# Patient Record
Sex: Female | Born: 1980 | Hispanic: No | Smoking: Never smoker
Health system: Southern US, Community
[De-identification: ages and names within clinical notes are randomized; demographics above are authoritative.]

## PROBLEM LIST (undated history)

## (undated) DIAGNOSIS — M1990 Unspecified osteoarthritis, unspecified site: Secondary | ICD-10-CM

---
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
## 1999-03-23 ENCOUNTER — Other Ambulatory Visit: Admission: RE | Admit: 1999-03-23 | Discharge: 1999-03-23 | Payer: Self-pay | Admitting: Obstetrics and Gynecology

## 2002-06-18 ENCOUNTER — Other Ambulatory Visit: Admission: RE | Admit: 2002-06-18 | Discharge: 2002-06-18 | Payer: Self-pay | Admitting: Obstetrics and Gynecology

## 2003-10-06 ENCOUNTER — Other Ambulatory Visit: Admission: RE | Admit: 2003-10-06 | Discharge: 2003-10-06 | Payer: Self-pay | Admitting: Obstetrics and Gynecology

## 2004-10-19 ENCOUNTER — Other Ambulatory Visit: Admission: RE | Admit: 2004-10-19 | Discharge: 2004-10-19 | Payer: Self-pay | Admitting: Obstetrics and Gynecology

## 2011-05-22 ENCOUNTER — Encounter: Payer: Self-pay | Admitting: Family Medicine

## 2011-06-06 ENCOUNTER — Encounter: Payer: Self-pay | Admitting: Family Medicine

## 2018-10-29 ENCOUNTER — Other Ambulatory Visit: Payer: Self-pay

## 2018-10-29 ENCOUNTER — Emergency Department (HOSPITAL_COMMUNITY): Payer: 59

## 2018-10-29 ENCOUNTER — Encounter (HOSPITAL_COMMUNITY): Payer: Self-pay | Admitting: Emergency Medicine

## 2018-10-29 ENCOUNTER — Emergency Department (HOSPITAL_COMMUNITY)
Admission: EM | Admit: 2018-10-29 | Discharge: 2018-10-29 | Disposition: A | Discharge status: Home / Self Care | Payer: 59 | Attending: Emergency Medicine | Admitting: Emergency Medicine

## 2018-10-29 DIAGNOSIS — R630 Anorexia: Secondary | ICD-10-CM | POA: Insufficient documentation

## 2018-10-29 DIAGNOSIS — R0789 Other chest pain: Secondary | ICD-10-CM | POA: Diagnosis present

## 2018-10-29 DIAGNOSIS — R110 Nausea: Secondary | ICD-10-CM | POA: Insufficient documentation

## 2018-10-29 HISTORY — DX: Unspecified osteoarthritis, unspecified site: M19.90

## 2018-10-29 LAB — CBC WITH DIFFERENTIAL/PLATELET
Abs Immature Granulocytes: 0.03 10*3/uL (ref 0.00–0.07)
Basophils Absolute: 0 10*3/uL (ref 0.0–0.1)
Basophils Relative: 0 %
Eosinophils Absolute: 0 10*3/uL (ref 0.0–0.5)
Eosinophils Relative: 0 %
HCT: 35.2 % — ABNORMAL LOW (ref 36.0–46.0)
Hemoglobin: 11.5 g/dL — ABNORMAL LOW (ref 12.0–15.0)
Immature Granulocytes: 0 %
Lymphocytes Relative: 14 %
Lymphs Abs: 1 10*3/uL (ref 0.7–4.0)
MCH: 29.9 pg (ref 26.0–34.0)
MCHC: 32.7 g/dL (ref 30.0–36.0)
MCV: 91.4 fL (ref 80.0–100.0)
Monocytes Absolute: 0.5 10*3/uL (ref 0.1–1.0)
Monocytes Relative: 7 %
NRBC: 0 % (ref 0.0–0.2)
Neutro Abs: 5.6 10*3/uL (ref 1.7–7.7)
Neutrophils Relative %: 79 %
Platelets: 246 10*3/uL (ref 150–400)
RBC: 3.85 MIL/uL — ABNORMAL LOW (ref 3.87–5.11)
RDW: 12.5 % (ref 11.5–15.5)
WBC: 7.2 10*3/uL (ref 4.0–10.5)

## 2018-10-29 LAB — COMPREHENSIVE METABOLIC PANEL
ALT: 16 U/L (ref 0–44)
AST: 20 U/L (ref 15–41)
Albumin: 4.1 g/dL (ref 3.5–5.0)
Alkaline Phosphatase: 61 U/L (ref 38–126)
Anion gap: 11 (ref 5–15)
BUN: 9 mg/dL (ref 6–20)
CO2: 23 mmol/L (ref 22–32)
Calcium: 9.1 mg/dL (ref 8.9–10.3)
Chloride: 105 mmol/L (ref 98–111)
Creatinine, Ser: 0.72 mg/dL (ref 0.44–1.00)
GFR calc Af Amer: >60 mL/min (ref >60)
GFR calc non Af Amer: >60 mL/min (ref >60)
Glucose, Bld: 105 mg/dL — ABNORMAL HIGH (ref 70–99)
Potassium: 3.2 mmol/L — ABNORMAL LOW (ref 3.5–5.1)
Sodium: 139 mmol/L (ref 135–145)
Total Bilirubin: 0.7 mg/dL (ref 0.3–1.2)
Total Protein: 6.9 g/dL (ref 6.5–8.1)

## 2018-10-29 LAB — SEDIMENTATION RATE: Sed Rate: 15 mm/hr (ref 0–22)

## 2018-10-29 LAB — I-STAT BETA HCG BLOOD, ED (MC, WL, AP ONLY): I-stat hCG, quantitative: <5 m[IU]/mL (ref <5)

## 2018-10-29 LAB — TROPONIN I: Troponin I: <0.03 ng/mL (ref <0.03)

## 2018-10-29 LAB — PROTIME-INR
INR: 1.04
Prothrombin Time: 13.6 seconds (ref 11.4–15.2)

## 2018-10-29 LAB — LIPASE, BLOOD: Lipase: 31 U/L (ref 11–51)

## 2018-10-29 LAB — CBG MONITORING, ED: Glucose-Capillary: 95 mg/dL (ref 70–99)

## 2018-10-29 LAB — MAGNESIUM: MAGNESIUM: 1.8 mg/dL (ref 1.7–2.4)

## 2018-10-29 MED ORDER — SUCRALFATE 1 G PO TABS: 1.0000 g | ORAL_TABLET | Freq: Three times a day (TID) | ORAL | 0 refills | Status: AC

## 2018-10-29 MED ORDER — OMEPRAZOLE 20 MG PO CPDR: 20.0000 mg | DELAYED_RELEASE_CAPSULE | Freq: Every day | ORAL | 0 refills | Status: AC

## 2018-10-29 MED ORDER — ASPIRIN 81 MG PO CHEW
324.0000 mg | CHEWABLE_TABLET | Freq: Once | ORAL | Status: AC
  Administered 2018-10-29: 324 mg via ORAL
  Filled 2018-10-29: qty 4

## 2018-10-29 NOTE — ED Triage Notes (Signed)
Pt reports mid chest pain onset intermittant over the last week hx anxiety attacks and multiple family stressors

## 2018-10-29 NOTE — ED Provider Notes (Signed)
[facility]-EMERGENCY DEPT Provider Note   CSN: [id number] Arrival date & time: 10/29/18  2123     History   Chief Complaint Chief Complaint  Patient presents with  . Chest Pain    HPI Angel Dickerson is a 37 y.o. female.  HPI Patient presents with concern of chest pain. Pain began about a week ago Pain has been persistent since onset, waxing, waning severity. Pain is focally in the sternum and between the clavicles. Pain is occasionally sore, severe, sharp. Patient has had no substantial change in spite of Tums and taking Protonix x4 over the past 48 hours. No fever, no vomiting, there is some nausea, anorexia. No diarrhea.  Past Medical History:  Diagnosis Date  . Arthritis     OB History   No obstetric history on file.      Home Medications    Prior to Admission medications   Medication Sig Start Date End Date Taking? Authorizing Provider  omeprazole (PRILOSEC) 20 MG capsule Take 1 capsule (20 mg total) by mouth daily. Take one tablet daily 10/29/18   Gerhard MunchLockwood, Humza Tallerico, MD  sucralfate (CARAFATE) 1 g tablet Take 1 tablet (1 g total) by mouth 4 (four) times daily -  with meals and at bedtime. 10/29/18   Gerhard MunchLockwood, River Mckercher, MD    Family History No family history on file.  Social History Social History   Tobacco Use  . Smoking status: Never Smoker  . Smokeless tobacco: Never Used  Substance Use Topics  . Alcohol use: Never    Frequency: Never  . Drug use: Never     Allergies   Patient has no known allergies.   Review of Systems Review of Systems  Constitutional:       Per HPI, otherwise negative  HENT:       Per HPI, otherwise negative  Respiratory:       Per HPI, otherwise negative  Cardiovascular:       Per HPI, otherwise negative  Gastrointestinal: Positive for nausea. Negative for vomiting.  Endocrine:       Negative aside from HPI  Genitourinary:       Neg aside from HPI   Musculoskeletal:       Per HPI,  otherwise negative  Skin: Negative.   Neurological: Negative for syncope.     Physical Exam Updated Vital Signs BP 121/74   Pulse 87   Temp 98.5 F (36.9 C) (Oral)   Resp 17   LMP 10/29/2018   SpO2 98%   Physical Exam Vitals signs and nursing note reviewed.  Constitutional:      General: She is not in acute distress.    Appearance: She is well-developed.  HENT:     Head: Normocephalic and atraumatic.  Eyes:     Conjunctiva/sclera: Conjunctivae normal.  Cardiovascular:     Rate and Rhythm: Regular rhythm. Tachycardia present.  Pulmonary:     Effort: Pulmonary effort is normal. No respiratory distress.     Breath sounds: Normal breath sounds. No stridor.  Abdominal:     General: There is no distension.     Palpations: Abdomen is soft.     Tenderness: There is no abdominal tenderness.  Skin:    General: Skin is warm and dry.  Neurological:     Mental Status: She is alert and oriented to person, place, and time.     Cranial Nerves: No cranial nerve deficit.      ED Treatments / Results  Labs (all labs  ordered are listed, but only abnormal results are displayed) Labs Reviewed  COMPREHENSIVE METABOLIC PANEL - Abnormal; Notable for the following components:      Result Value   Potassium 3.2 (*)    Glucose, Bld 105 (*)    All other components within normal limits  CBC WITH DIFFERENTIAL/PLATELET - Abnormal; Notable for the following components:   RBC 3.85 (*)    Hemoglobin 11.5 (*)    HCT 35.2 (*)    All other components within normal limits  MAGNESIUM  TROPONIN I  PROTIME-INR  LIPASE, BLOOD  SEDIMENTATION RATE  CBG MONITORING, ED  I-STAT BETA HCG BLOOD, ED ([facility], [facility], [facility] ONLY)    EKG EKG Interpretation  Date/Time:  Wednesday October 29 2018 21:31:49 EST Ventricular Rate:  101 PR Interval:    QRS Duration: 91 QT Interval:  342 QTC Calculation: 444 R Axis:   75 Text Interpretation:  Sinus tachycardia Ventricular premature complex Nonspecific repol  abnormality, diffuse leads Baseline wander Abnormal ekg Confirmed by Gerhard MunchLockwood, Jaylinn Hellenbrand [phone]) on 10/29/2018 9:40:16 PM   Radiology Dg Chest 2 View  Result Date: 10/29/2018 CLINICAL DATA:  Chest pain EXAM: CHEST - 2 VIEW COMPARISON:  05/05/2013 FINDINGS: Cardiac shadow is within normal limits. The lungs are well aerated bilaterally. No focal infiltrate or sizable effusion is seen. No bony abnormality is noted. IMPRESSION: No acute abnormality noted. Electronically Signed   By: Alcide CleverMark  Lukens M.D.   On: 10/29/2018 22:34    Procedures Procedures (including critical care time)  Medications Ordered in ED Medications  aspirin chewable tablet 324 mg (324 mg Oral Given 10/29/18 2213)     Initial Impression / Assessment and Plan / ED Course  I have reviewed the triage vital signs and the nursing notes.  Pertinent labs & imaging results that were available during my care of the patient were reviewed by me and considered in my medical decision making (see chart for details).     11:39 PM Repeat exam the patient is awake, alert, in no distress. We discussed all findings, labs, including reassuring troponin, sed rate, lipase, no evidence for atypical ACS, low suspicion for pericarditis, pancreatitis hepatobiliary dysfunction or other acute new pathology. Patient now notes that she has had dietary laxity in the past few weeks, increasing stress as well. We discussed the importance of following up with primary care With suspicion for gastroesophageal etiology patient will start PPI, Carafate.  Final Clinical Impressions(s) / ED Diagnoses   Final diagnoses:  Atypical chest pain    ED Discharge Orders         Ordered    omeprazole (PRILOSEC) 20 MG capsule  Daily     10/29/18 2338    sucralfate (CARAFATE) 1 g tablet  3 times daily with meals & bedtime    Note to Pharmacy:  Take for one week   10/29/18 2338           Gerhard MunchLockwood, Shakeena Kafer, MD 10/29/18 2340

## 2018-10-29 NOTE — Discharge Instructions (Signed)
As discussed, your evaluation today has been largely reassuring.  But, it is important that you monitor your condition carefully, and do not hesitate to return to the ED if you develop new, or concerning changes in your condition. ? ?Otherwise, please follow-up with your physician for appropriate ongoing care. ? ?

## 2019-06-16 IMAGING — CR DG CHEST 2V
2 series · 2 of 2 positions shown · non-contrast
Comparison: 05/05/2013

CLINICAL DATA: Chest pain

EXAM:
CHEST - 2 VIEW

[w chest pa]
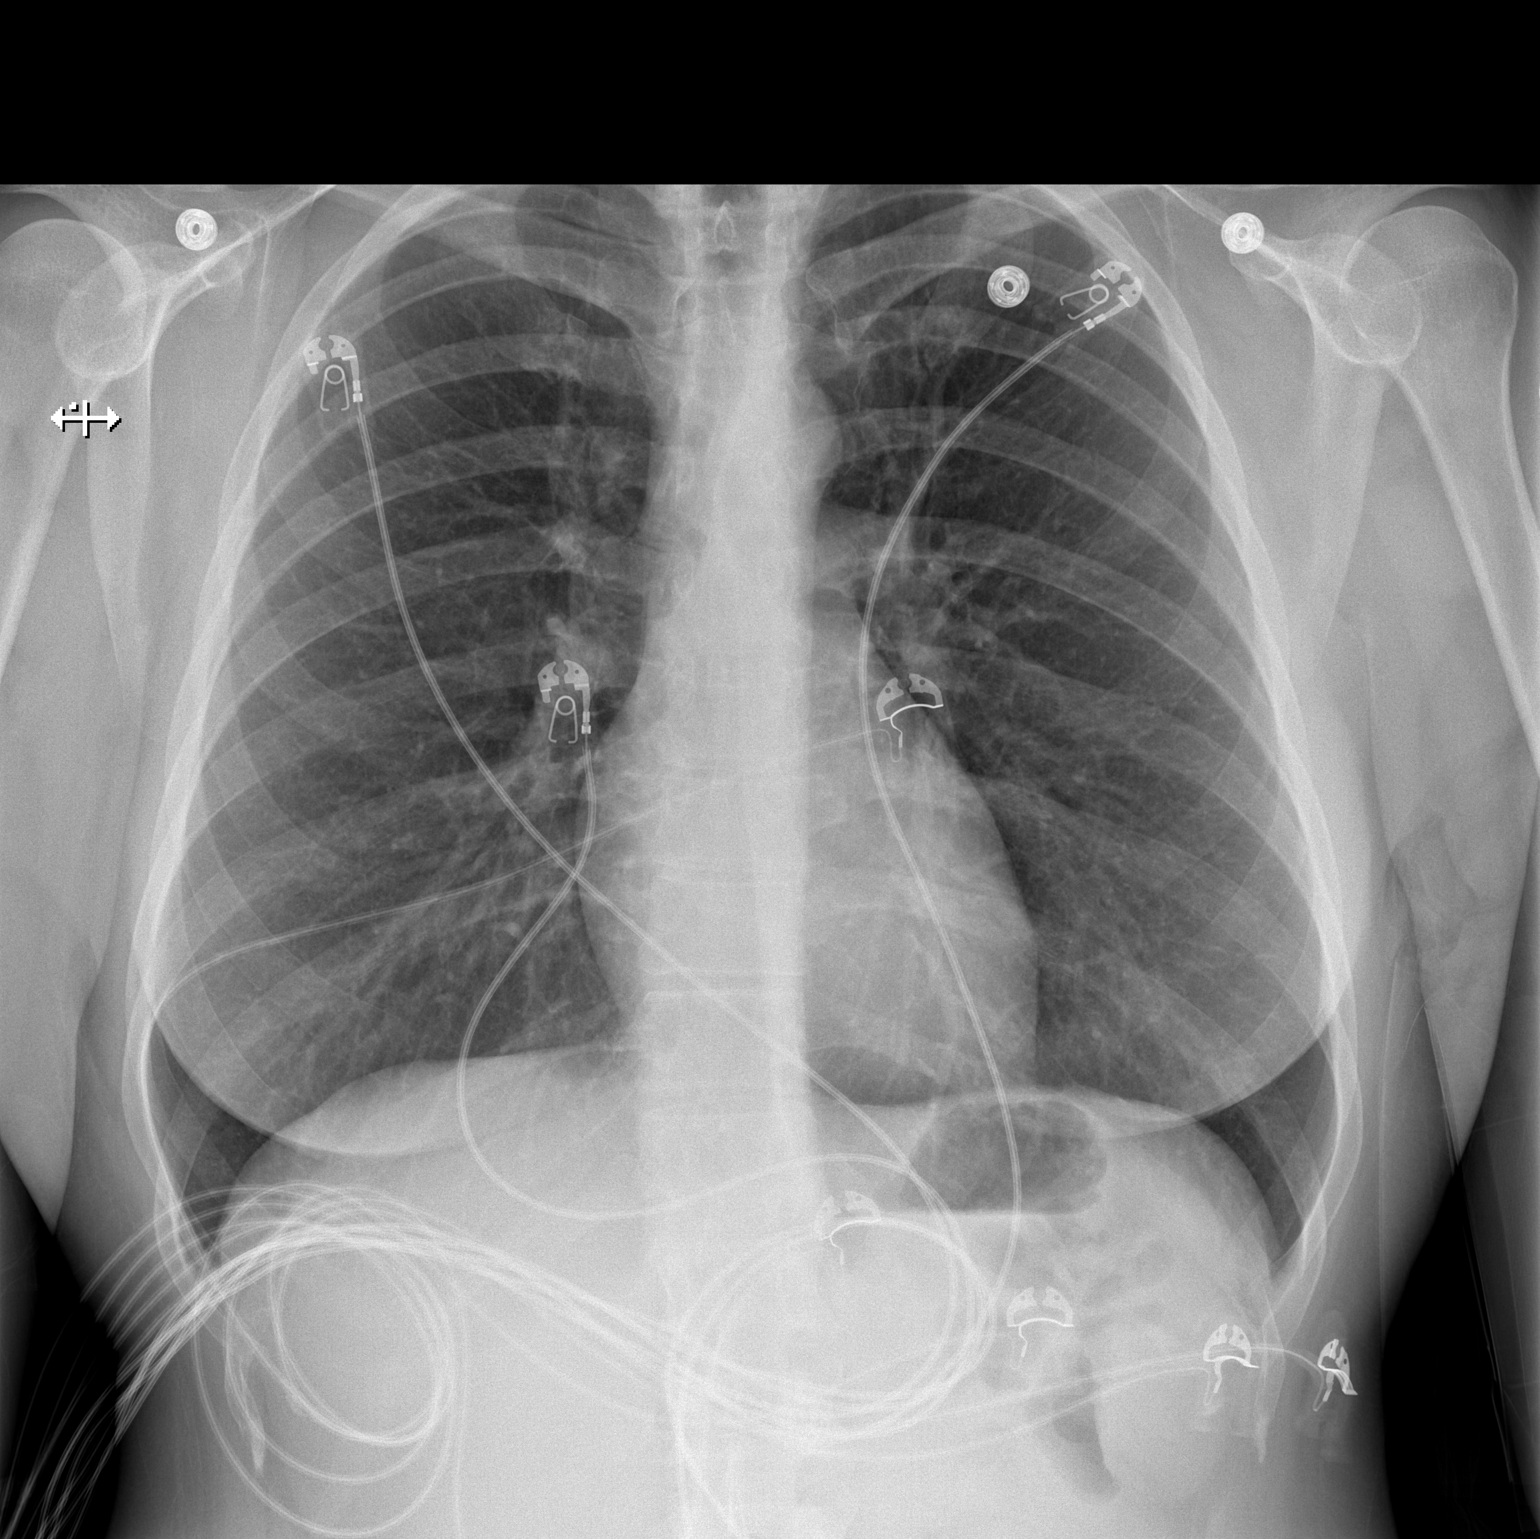

[w chest lat]
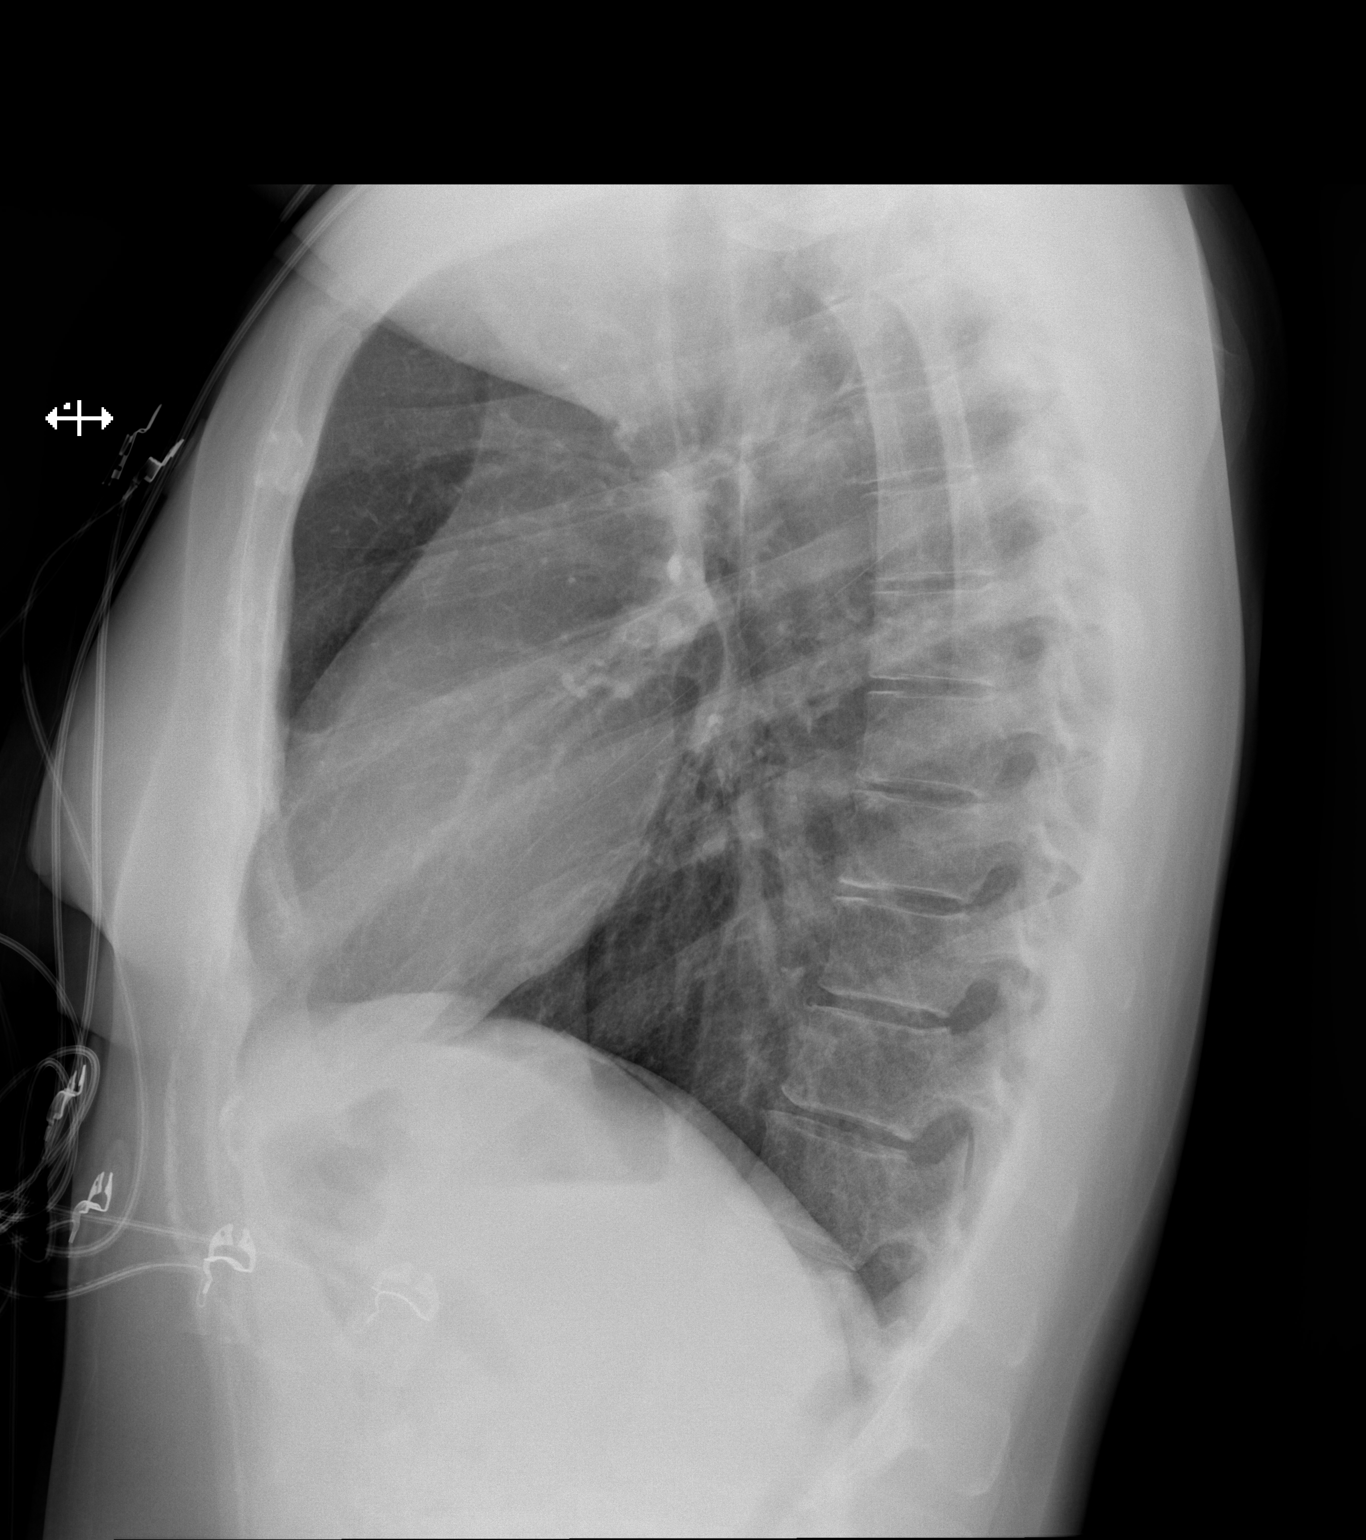

[2 of 2 positions shown; findings below may reference images not displayed]

FINDINGS: Cardiac shadow is within normal limits. The lungs are well aerated
bilaterally. No focal infiltrate or sizable effusion is seen. No
bony abnormality is noted.
IMPRESSION: No acute abnormality noted.

## 2020-07-06 ENCOUNTER — Encounter (HOSPITAL_COMMUNITY): Payer: Self-pay | Admitting: Emergency Medicine

## 2020-07-06 ENCOUNTER — Emergency Department (HOSPITAL_COMMUNITY)
Admission: EM | Admit: 2020-07-06 | Discharge: 2020-07-06 | Disposition: A | Discharge status: Home / Self Care | Payer: 59 | Attending: Emergency Medicine | Admitting: Emergency Medicine

## 2020-07-06 ENCOUNTER — Other Ambulatory Visit: Payer: Self-pay

## 2020-07-06 DIAGNOSIS — R5383 Other fatigue: Secondary | ICD-10-CM | POA: Diagnosis present

## 2020-07-06 DIAGNOSIS — U071 COVID-19: Secondary | ICD-10-CM | POA: Diagnosis not present

## 2020-07-06 LAB — COMPREHENSIVE METABOLIC PANEL
ALT: 38 U/L (ref 0–44)
AST: 46 U/L — ABNORMAL HIGH (ref 15–41)
Albumin: 3.7 g/dL (ref 3.5–5.0)
Alkaline Phosphatase: 68 U/L (ref 38–126)
Anion gap: 12 (ref 5–15)
BUN: 7 mg/dL (ref 6–20)
CO2: 24 mmol/L (ref 22–32)
Calcium: 8.3 mg/dL — ABNORMAL LOW (ref 8.9–10.3)
Chloride: 103 mmol/L (ref 98–111)
Creatinine, Ser: 0.53 mg/dL (ref 0.44–1.00)
GFR calc Af Amer: >60 mL/min (ref >60)
GFR calc non Af Amer: >60 mL/min (ref >60)
Glucose, Bld: 103 mg/dL — ABNORMAL HIGH (ref 70–99)
Potassium: 3.7 mmol/L (ref 3.5–5.1)
Sodium: 139 mmol/L (ref 135–145)
Total Bilirubin: 0.5 mg/dL (ref 0.3–1.2)
Total Protein: 7.4 g/dL (ref 6.5–8.1)

## 2020-07-06 LAB — CBC
HCT: 41.3 % (ref 36.0–46.0)
Hemoglobin: 13.9 g/dL (ref 12.0–15.0)
MCH: 29.8 pg (ref 26.0–34.0)
MCHC: 33.7 g/dL (ref 30.0–36.0)
MCV: 88.4 fL (ref 80.0–100.0)
Platelets: 218 10*3/uL (ref 150–400)
RBC: 4.67 MIL/uL (ref 3.87–5.11)
RDW: 12.3 % (ref 11.5–15.5)
WBC: 3.8 10*3/uL — ABNORMAL LOW (ref 4.0–10.5)
nRBC: 0 % (ref 0.0–0.2)

## 2020-07-06 LAB — I-STAT BETA HCG BLOOD, ED (MC, WL, AP ONLY): I-stat hCG, quantitative: <5 m[IU]/mL (ref <5)

## 2020-07-06 LAB — LIPASE, BLOOD: Lipase: 23 U/L (ref 11–51)

## 2020-07-06 MED ORDER — SODIUM CHLORIDE 0.9 % IV BOLUS
1000.0000 mL | Freq: Once | INTRAVENOUS | Status: AC
  Administered 2020-07-06: 1000 mL via INTRAVENOUS

## 2020-07-06 MED ORDER — ONDANSETRON HCL 4 MG/2ML IJ SOLN
4.0000 mg | Freq: Once | INTRAMUSCULAR | Status: AC
  Administered 2020-07-06: 4 mg via INTRAVENOUS
  Filled 2020-07-06: qty 2

## 2020-07-06 MED ORDER — ONDANSETRON 4 MG PO TBDP: 4.0000 mg | ORAL_TABLET | Freq: Three times a day (TID) | ORAL | 0 refills | Status: AC | PRN

## 2020-07-06 MED ORDER — KETOROLAC TROMETHAMINE 30 MG/ML IJ SOLN
15.0000 mg | Freq: Once | INTRAMUSCULAR | Status: AC
  Administered 2020-07-06: 15 mg via INTRAVENOUS
  Filled 2020-07-06: qty 1

## 2020-07-06 NOTE — Discharge Instructions (Signed)
You were seen today for symptoms related to COVID-19. Continue supportive measures at home and make sure you are staying hydrated. You will be given some Zofran for nausea.

## 2020-07-06 NOTE — ED Provider Notes (Signed)
[facility]-EMERGENCY DEPT Provider Note   CSN: [id number] Arrival date & time: 07/06/20  0355     History Chief Complaint  Patient presents with  . Abdominal Pain    Angel Dickerson is a 39 y.o. female.  HPI     This is a 39 year old female with a history of rheumatoid arthritis who presents with myalgias, fatigue, and nausea.  Patient reports that she tested positive for COVID-19 last Monday, August 23.  She received Regeneron as an outpatient.  She reports persistent chills, diffuse myalgias, and nausea at home.  She states her symptoms have progressed and worsened.  She is taking Tylenol and Motrin without significant relief.  She reports a 13 pound weight loss.  She has not had any significant shortness of breath or chest pain.  She states that she is using an incentive spirometer at home and has not noted low O2 sats.  She was not vaccinated against COVID-19.  Past Medical History:  Diagnosis Date  . Arthritis     There are no problems to display for this patient.   History reviewed. No pertinent surgical history.   OB History   No obstetric history on file.     History reviewed. No pertinent family history.  Social History   Tobacco Use  . Smoking status: Never Smoker  . Smokeless tobacco: Never Used  Substance Use Topics  . Alcohol use: Never  . Drug use: Never    Home Medications Prior to Admission medications   Medication Sig Start Date End Date Taking? Authorizing Provider  omeprazole (PRILOSEC) 20 MG capsule Take 1 capsule (20 mg total) by mouth daily. Take one tablet daily 10/29/18   Gerhard Munch, MD  ondansetron (ZOFRAN ODT) 4 MG disintegrating tablet Take 1 tablet (4 mg total) by mouth every 8 (eight) hours as needed for nausea or vomiting. 07/06/20   Morrell Fluke, Mayer Masker, MD  sucralfate (CARAFATE) 1 g tablet Take 1 tablet (1 g total) by mouth 4 (four) times daily -  with meals and at bedtime. 10/29/18   Gerhard Munch, MD     Allergies    Patient has no known allergies.  Review of Systems   Review of Systems  Constitutional: Positive for appetite change. Negative for fever.  Respiratory: Negative for cough and shortness of breath.   Cardiovascular: Negative for chest pain.  Gastrointestinal: Positive for nausea. Negative for abdominal pain and vomiting.  Genitourinary: Negative for dysuria.  Musculoskeletal: Positive for myalgias.  All other systems reviewed and are negative.   Physical Exam Updated Vital Signs BP 132/85 (BP Location: Left Arm)   Pulse 93   Temp 98.5 F (36.9 C) (Oral)   Resp 16   Ht 1.803 m (5\' 11" )   Wt 88 kg   SpO2 94%   BMI 27.06 kg/m   Physical Exam Vitals and nursing note reviewed.  Constitutional:      Appearance: She is well-developed.  HENT:     Head: Normocephalic and atraumatic.     Mouth/Throat:     Mouth: Mucous membranes are moist.  Eyes:     Pupils: Pupils are equal, round, and reactive to light.  Cardiovascular:     Rate and Rhythm: Normal rate and regular rhythm.     Heart sounds: Normal heart sounds.  Pulmonary:     Effort: Pulmonary effort is normal. No respiratory distress.     Breath sounds: No wheezing.  Abdominal:     General: Bowel sounds are normal.  Palpations: Abdomen is soft.     Tenderness: There is no abdominal tenderness. There is no guarding or rebound.  Musculoskeletal:     Cervical back: Neck supple.  Skin:    General: Skin is warm and dry.  Neurological:     Mental Status: She is alert and oriented to person, place, and time.  Psychiatric:        Mood and Affect: Mood normal.     ED Results / Procedures / Treatments   Labs (all labs ordered are listed, but only abnormal results are displayed) Labs Reviewed  COMPREHENSIVE METABOLIC PANEL - Abnormal; Notable for the following components:      Result Value   Glucose, Bld 103 (*)    Calcium 8.3 (*)    AST 46 (*)    All other components within normal limits  CBC -  Abnormal; Notable for the following components:   WBC 3.8 (*)    All other components within normal limits  LIPASE, BLOOD  URINALYSIS, ROUTINE W REFLEX MICROSCOPIC  I-STAT BETA HCG BLOOD, ED ([facility], [facility], [facility] ONLY)    EKG None  Radiology No results found.  Procedures Procedures (including critical care time)  Medications Ordered in ED Medications  sodium chloride 0.9 % bolus 1,000 mL (1,000 mLs Intravenous New Bag/Given 07/06/20 0550)  ondansetron (ZOFRAN) injection 4 mg (4 mg Intravenous Given 07/06/20 0550)  ketorolac (TORADOL) 30 MG/ML injection 15 mg (15 mg Intravenous Given 07/06/20 0549)    ED Course  I have reviewed the triage vital signs and the nursing notes.  Pertinent labs & imaging results that were available during my care of the patient were reviewed by me and considered in my medical decision making (see chart for details).    MDM Rules/Calculators/A&P                           Patient presents with ongoing symptoms of COVID-19. She is unvaccinated. She did receive Regeneron. She is overall nontoxic and vital signs are reassuring. She is not hypoxic. She mostly is having persistent myalgias, nausea, and weight loss. Vital signs are reassuring. Labs reviewed from triage. No significant evidence of dehydration or laboratory derangement. She is mildly leukopenic. We discussed ongoing supportive measures. She was given a liter of fluids, Zofran, and Toradol for her symptoms here. We will give her a prescription for Zofran for ongoing nausea at home. Continue Tylenol and ibuprofen.  After history, exam, and medical workup I feel the patient has been appropriately medically screened and is safe for discharge home. Pertinent diagnoses were discussed with the patient. Patient was given return precautions.  Angel Dickerson was evaluated in Emergency Department on 07/06/2020 for the symptoms described in the history of present illness. She was evaluated in the context of the global  COVID-19 pandemic, which necessitated consideration that the patient might be at risk for infection with the SARS-CoV-2 virus that causes COVID-19. Institutional protocols and algorithms that pertain to the evaluation of patients at risk for COVID-19 are in a state of rapid change based on information released by regulatory bodies including the CDC and federal and state organizations. These policies and algorithms were followed during the patient's care in the ED.   Final Clinical Impression(s) / ED Diagnoses Final diagnoses:  COVID-19    Rx / DC Orders ED Discharge Orders         Ordered    ondansetron (ZOFRAN ODT) 4 MG disintegrating tablet  Every 8 hours PRN        07/06/20 0619           Shon Baton, MD 07/06/20 [phone]

## 2020-07-06 NOTE — ED Triage Notes (Signed)
Patient is covid positive. Patient states that her stomach is hurting. Patient states that she feels bad.
